# Patient Record
Sex: Female | Born: 1995 | Race: White | Hispanic: No | Marital: Single | Smoking: Never smoker
Health system: Southern US, Community
[De-identification: ages and names within clinical notes are randomized; demographics above are authoritative.]

## PROBLEM LIST (undated history)

## (undated) DIAGNOSIS — R569 Unspecified convulsions: Secondary | ICD-10-CM

## (undated) DIAGNOSIS — G43909 Migraine, unspecified, not intractable, without status migrainosus: Secondary | ICD-10-CM

## (undated) DIAGNOSIS — J45909 Unspecified asthma, uncomplicated: Secondary | ICD-10-CM

## (undated) HISTORY — PX: NO PAST SURGERIES: SHX2092

## (undated) HISTORY — DX: Migraine, unspecified, not intractable, without status migrainosus: G43.909

---
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
## 2015-01-06 IMAGING — CR DG FOOT COMPLETE 3+V*L*
3 series · 3 of 3 positions shown · non-contrast
Comparison: None.

CLINICAL DATA: 17-year-old female with pain status post injury 1
day ago. Initial encounter.

EXAM:
LEFT FOOT - COMPLETE 3+ VIEW

[x foot ap left]
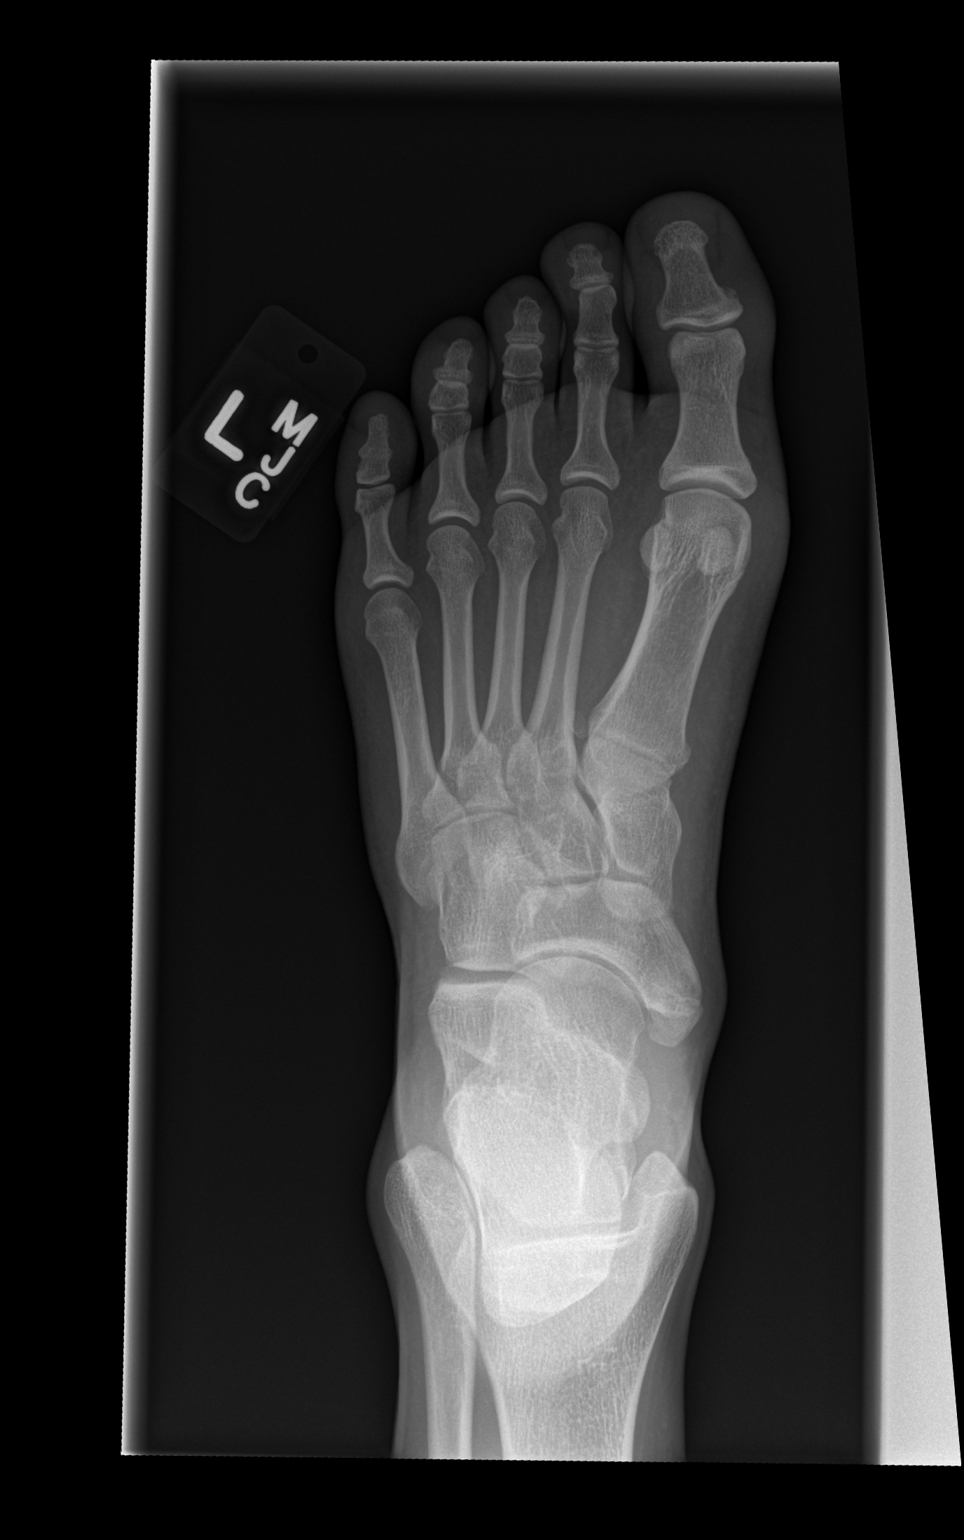

[x foot obl left]
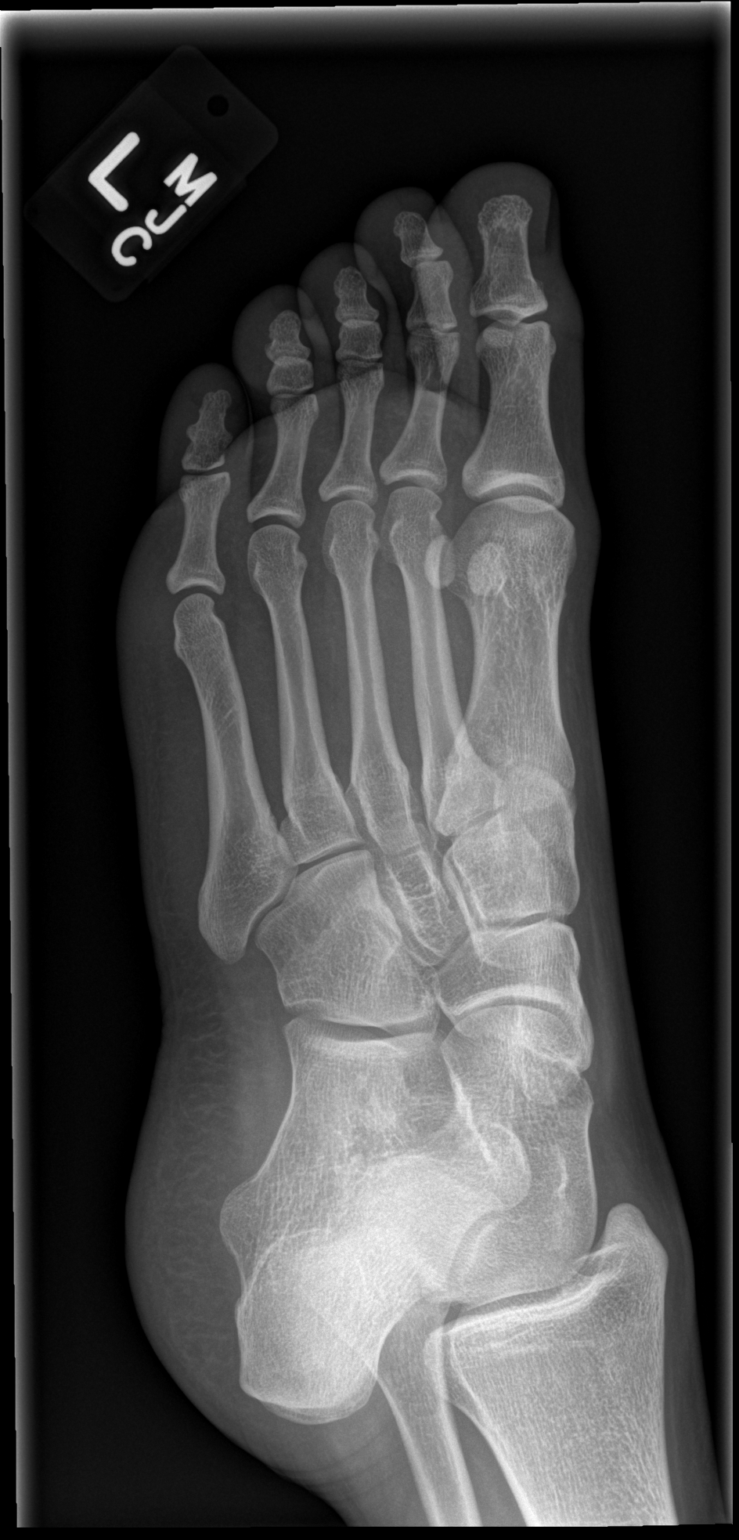

[x foot lat left]
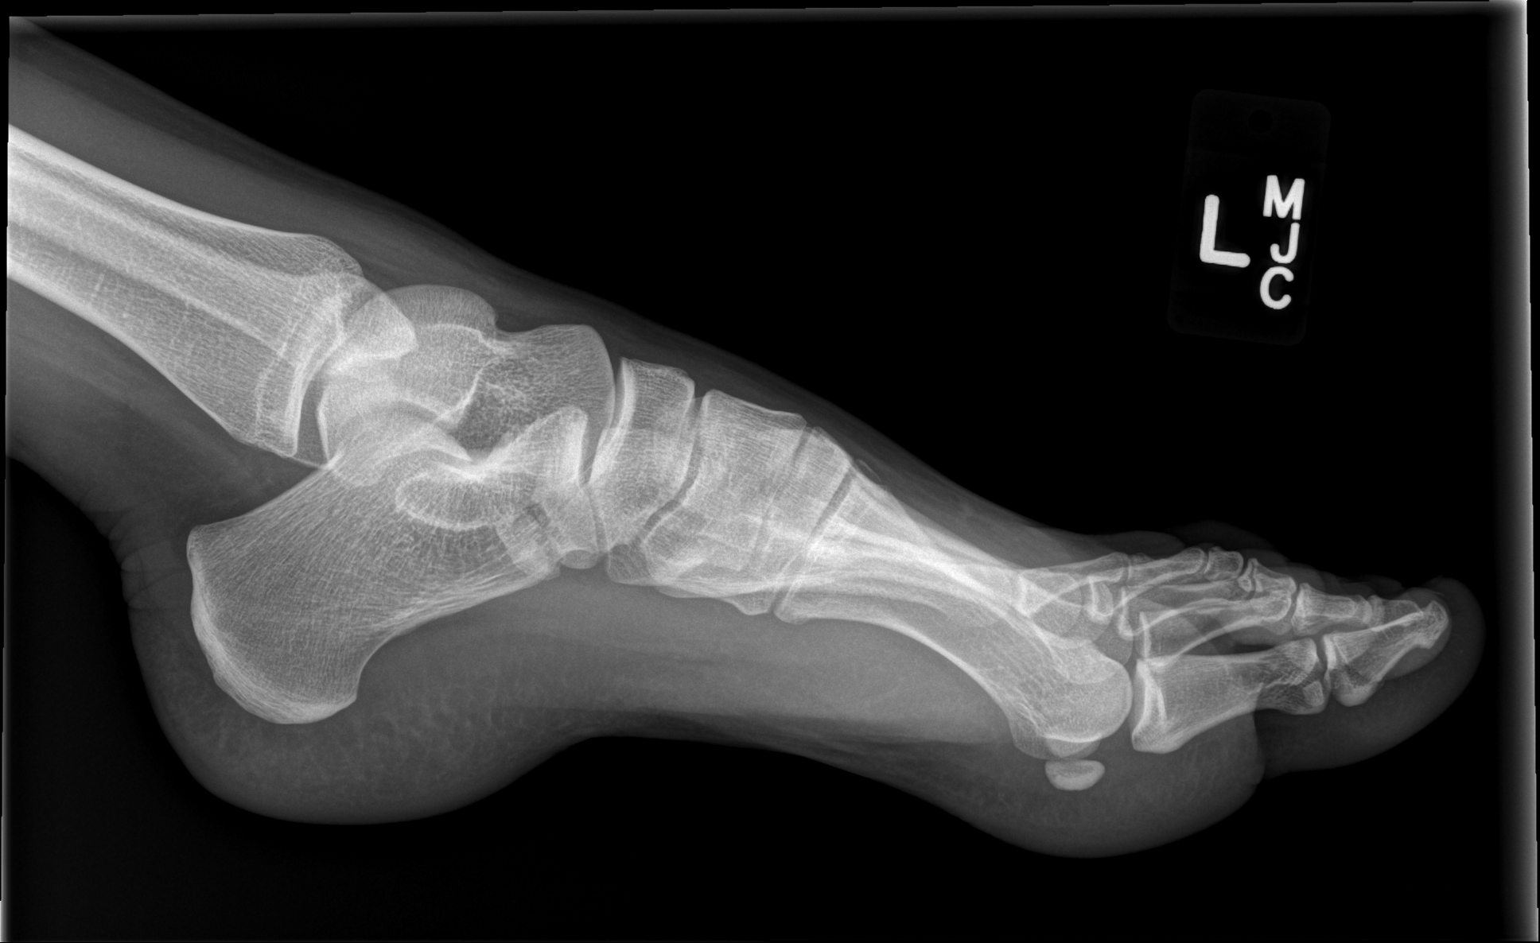

[3 of 3 positions shown; findings below may reference images not displayed]

FINDINGS: Bone mineralization is within normal limits. The left foot is
skeletally mature. Calcaneus intact. Joint spaces and alignment
within normal limits. No fracture or dislocation identified. Small
accessory ossicle situated between the proximal first and second
metatarsals suspected.
IMPRESSION: No acute fracture or dislocation identified about the left foot.

## 2015-01-06 IMAGING — DX DG ANKLE COMPLETE 3+V*L*
3 series · 3 of 3 positions shown · non-contrast
Comparison: None.

CLINICAL DATA: Injury.

EXAM:
LEFT ANKLE COMPLETE - 3+ VIEW

[ankle ap]
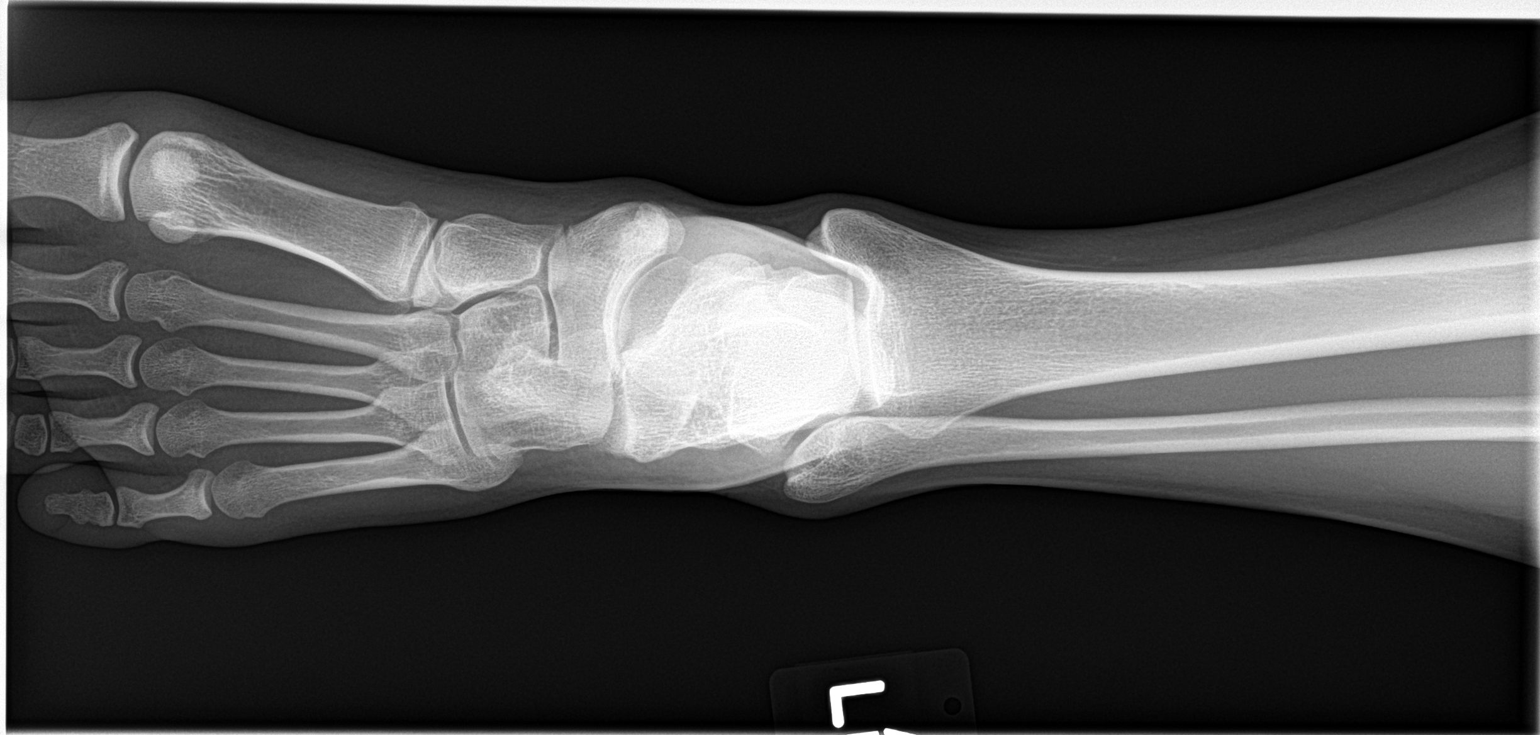

[ankle obl]
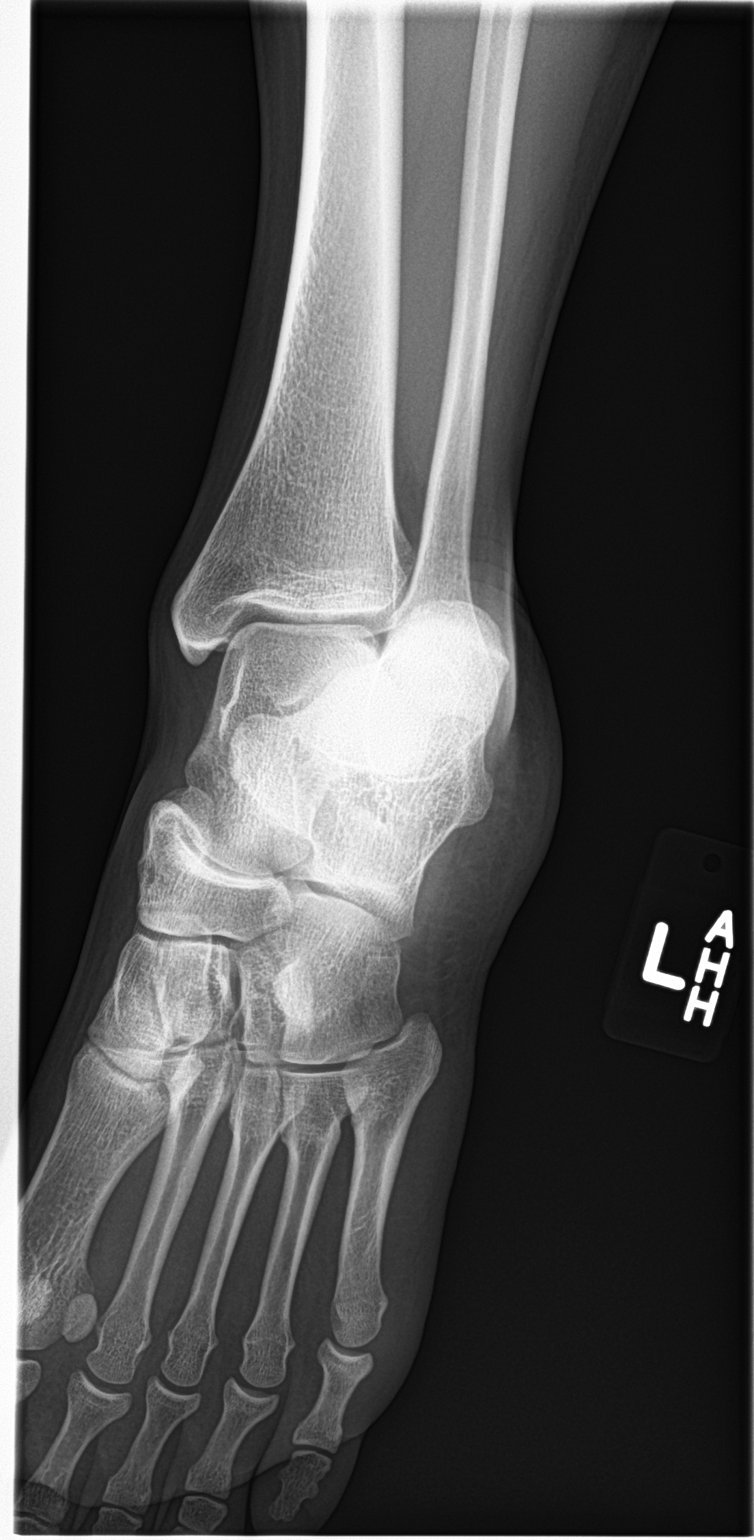

[ankle lat]
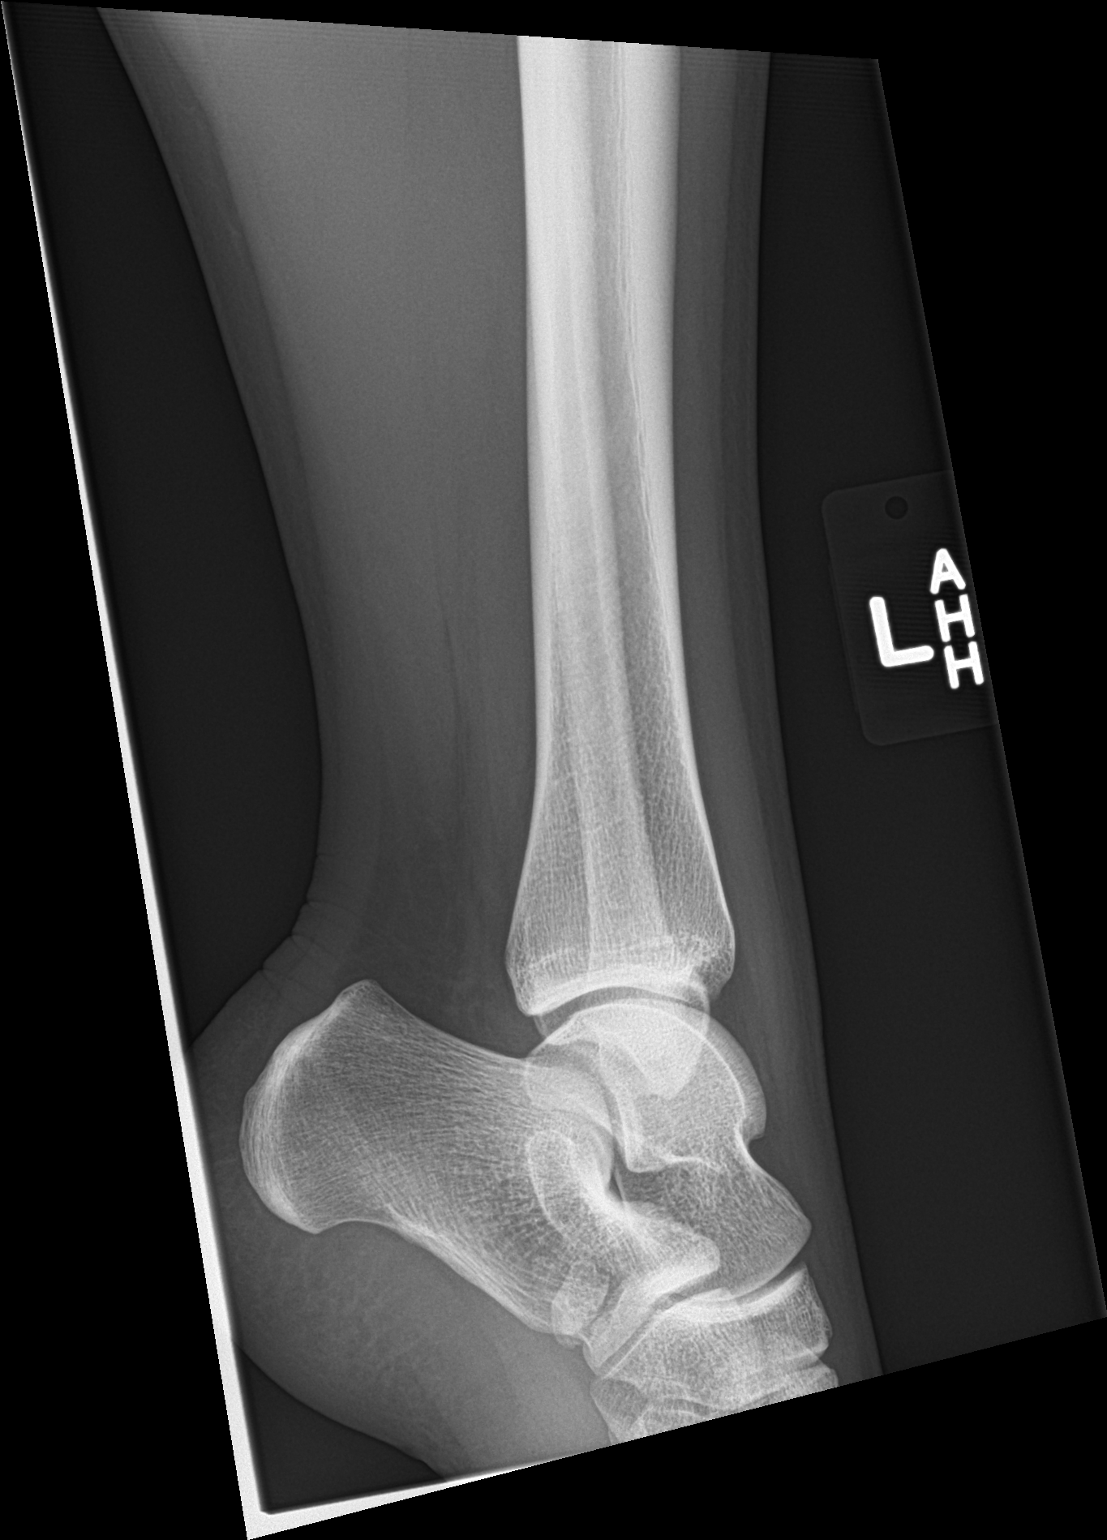

[3 of 3 positions shown; findings below may reference images not displayed]

FINDINGS: Soft tissue structures are unremarkable. No evidence of fracture or
dislocation.
IMPRESSION: Negative.

## 2015-04-10 ENCOUNTER — Encounter: Payer: Self-pay | Admitting: Pediatrics

## 2015-04-10 NOTE — Progress Notes (Signed)
Pre-Visit Planning  Donna Shea  is a 19 y.o. female referred by Norman Clay, MD.   [facility] for nexplanon followup .   Previous Psych Screenings?  no  Treatment plan at last visit included f/u nexplanon. Going well. Rash on hands- dermatitis    Clinical Staff Visit Tasks:   - Urine GC/CT due? If new partner  - Psych Screenings Due? no  Provider Visit Tasks: - assess nexplanon  -counsel on condoms for college, other concerns  - Pertinent Labs? no

## 2015-04-11 ENCOUNTER — Ambulatory Visit: Payer: Self-pay | Admitting: Pediatrics

## 2015-04-18 ENCOUNTER — Ambulatory Visit: Payer: No Typology Code available for payment source | Admitting: Pediatrics

## 2016-04-04 ENCOUNTER — Encounter: Payer: Self-pay | Admitting: Pediatrics

## 2016-04-05 ENCOUNTER — Encounter: Payer: Self-pay | Admitting: Pediatrics

## 2019-12-12 ENCOUNTER — Ambulatory Visit: Payer: Medicaid Other | Attending: Internal Medicine
